# Patient Record
Sex: Female | Born: 2000 | Race: Black or African American | Hispanic: No | Marital: Single | Smoking: Current every day smoker
Health system: Southern US, Community
[De-identification: ages and names within clinical notes are randomized; demographics above are authoritative.]

## PROBLEM LIST (undated history)

## (undated) ENCOUNTER — Ambulatory Visit: Payer: BC Managed Care – PPO

---
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
## 2022-08-23 ENCOUNTER — Other Ambulatory Visit: Payer: Self-pay

## 2022-08-23 ENCOUNTER — Emergency Department (HOSPITAL_COMMUNITY)
Admission: EM | Admit: 2022-08-23 | Discharge: 2022-08-23 | Disposition: A | Discharge status: Home / Self Care | Payer: BC Managed Care – PPO | Attending: Emergency Medicine | Admitting: Emergency Medicine

## 2022-08-23 ENCOUNTER — Ambulatory Visit
Admission: EM | Admit: 2022-08-23 | Discharge: 2022-08-23 | Disposition: A | Discharge status: Home / Self Care | Payer: BC Managed Care – PPO | Attending: Internal Medicine | Admitting: Internal Medicine

## 2022-08-23 ENCOUNTER — Emergency Department (HOSPITAL_COMMUNITY): Payer: BC Managed Care – PPO

## 2022-08-23 ENCOUNTER — Encounter: Payer: Self-pay | Admitting: Emergency Medicine

## 2022-08-23 DIAGNOSIS — Z3202 Encounter for pregnancy test, result negative: Secondary | ICD-10-CM | POA: Diagnosis not present

## 2022-08-23 DIAGNOSIS — R350 Frequency of micturition: Secondary | ICD-10-CM

## 2022-08-23 DIAGNOSIS — R109 Unspecified abdominal pain: Secondary | ICD-10-CM

## 2022-08-23 DIAGNOSIS — N83291 Other ovarian cyst, right side: Secondary | ICD-10-CM | POA: Diagnosis not present

## 2022-08-23 DIAGNOSIS — N9489 Other specified conditions associated with female genital organs and menstrual cycle: Secondary | ICD-10-CM | POA: Diagnosis not present

## 2022-08-23 DIAGNOSIS — N760 Acute vaginitis: Secondary | ICD-10-CM | POA: Diagnosis not present

## 2022-08-23 DIAGNOSIS — R112 Nausea with vomiting, unspecified: Secondary | ICD-10-CM | POA: Diagnosis not present

## 2022-08-23 DIAGNOSIS — R0789 Other chest pain: Secondary | ICD-10-CM | POA: Insufficient documentation

## 2022-08-23 DIAGNOSIS — N939 Abnormal uterine and vaginal bleeding, unspecified: Secondary | ICD-10-CM

## 2022-08-23 DIAGNOSIS — R300 Dysuria: Secondary | ICD-10-CM | POA: Diagnosis not present

## 2022-08-23 DIAGNOSIS — B9689 Other specified bacterial agents as the cause of diseases classified elsewhere: Secondary | ICD-10-CM | POA: Insufficient documentation

## 2022-08-23 DIAGNOSIS — R197 Diarrhea, unspecified: Secondary | ICD-10-CM

## 2022-08-23 DIAGNOSIS — N83201 Unspecified ovarian cyst, right side: Secondary | ICD-10-CM

## 2022-08-23 LAB — WET PREP, GENITAL
Sperm: NONE SEEN
Trich, Wet Prep: NONE SEEN
WBC, Wet Prep HPF POC: <10 (ref <10)
Yeast Wet Prep HPF POC: NONE SEEN

## 2022-08-23 LAB — POCT URINALYSIS DIP (MANUAL ENTRY)
Bilirubin, UA: NEGATIVE
Blood, UA: NEGATIVE
Glucose, UA: NEGATIVE mg/dL
Ketones, POC UA: NEGATIVE mg/dL
Leukocytes, UA: NEGATIVE
Nitrite, UA: NEGATIVE
Spec Grav, UA: 1.02 (ref 1.010–1.025)
Urobilinogen, UA: 0.2 E.U./dL
pH, UA: 7 (ref 5.0–8.0)

## 2022-08-23 LAB — COMPREHENSIVE METABOLIC PANEL
ALT: 10 U/L (ref 0–44)
AST: 15 U/L (ref 15–41)
Albumin: 3.8 g/dL (ref 3.5–5.0)
Alkaline Phosphatase: 93 U/L (ref 38–126)
Anion gap: 9 (ref 5–15)
BUN: 8 mg/dL (ref 6–20)
CO2: 22 mmol/L (ref 22–32)
Calcium: 9.1 mg/dL (ref 8.9–10.3)
Chloride: 106 mmol/L (ref 98–111)
Creatinine, Ser: 0.7 mg/dL (ref 0.44–1.00)
GFR, Estimated: >60 mL/min (ref >60)
Glucose, Bld: 94 mg/dL (ref 70–99)
Potassium: 3.8 mmol/L (ref 3.5–5.1)
Sodium: 137 mmol/L (ref 135–145)
Total Bilirubin: 0.5 mg/dL (ref 0.3–1.2)
Total Protein: 7.5 g/dL (ref 6.5–8.1)

## 2022-08-23 LAB — CBC
HCT: 38.4 % (ref 36.0–46.0)
Hemoglobin: 12.7 g/dL (ref 12.0–15.0)
MCH: 30.7 pg (ref 26.0–34.0)
MCHC: 33.1 g/dL (ref 30.0–36.0)
MCV: 92.8 fL (ref 80.0–100.0)
Platelets: 314 10*3/uL (ref 150–400)
RBC: 4.14 MIL/uL (ref 3.87–5.11)
RDW: 11.9 % (ref 11.5–15.5)
WBC: 12 10*3/uL — ABNORMAL HIGH (ref 4.0–10.5)
nRBC: 0 % (ref 0.0–0.2)

## 2022-08-23 LAB — URINALYSIS, ROUTINE W REFLEX MICROSCOPIC
Bilirubin Urine: NEGATIVE
Glucose, UA: NEGATIVE mg/dL
Hgb urine dipstick: NEGATIVE
Ketones, ur: 5 mg/dL — AB
Nitrite: NEGATIVE
Protein, ur: 30 mg/dL — AB
Specific Gravity, Urine: 1.025 (ref 1.005–1.030)
WBC, UA: >50 WBC/hpf — ABNORMAL HIGH (ref 0–5)
pH: 7 (ref 5.0–8.0)

## 2022-08-23 LAB — I-STAT BETA HCG BLOOD, ED (MC, WL, AP ONLY): I-stat hCG, quantitative: <5 m[IU]/mL (ref <5)

## 2022-08-23 LAB — LIPASE, BLOOD: Lipase: 37 U/L (ref 11–51)

## 2022-08-23 LAB — D-DIMER, QUANTITATIVE: D-Dimer, Quant: 2.16 ug/mL-FEU — ABNORMAL HIGH (ref 0.00–0.50)

## 2022-08-23 LAB — POCT URINE PREGNANCY: Preg Test, Ur: NEGATIVE

## 2022-08-23 MED ORDER — IOHEXOL 350 MG/ML SOLN
100.0000 mL | Freq: Once | INTRAVENOUS | Status: AC | PRN
  Administered 2022-08-23: 100 mL via INTRAVENOUS

## 2022-08-23 MED ORDER — METRONIDAZOLE 500 MG PO TABS: 500.0000 mg | ORAL_TABLET | Freq: Two times a day (BID) | ORAL | 0 refills | Status: AC

## 2022-08-23 NOTE — ED Provider Notes (Signed)
[facility]-EMERGENCY DEPT Provider Note   CSN: [id number] Arrival date & time: 08/23/22  1020     History  Chief Complaint  Patient presents with   Abdominal Pain    Jane Friedman is a 21 y.o. female.   Abdominal Pain Patient presents with right-sided chest pain and right side abdominal pain.  Sent from urgent care.  Has had some dysuria and urinary symptoms for around a month now.  States she took around 2 to 3 days of antibiotics that she had and some Azo from the pharmacy.  States now pain is worse.  Some frequency.  Also has had some vaginal discharge.  States it is yellow.  States she is worried she has an STD.  However pain worse on the right side.  Menses has been somewhat irregular also.  Has had unprotected sex.  Patient is not pregnant. Also has had right upper chest pain.  Pleuritic.  Does not feel short of breath but pain worse with a deep breath.  No fever.  No coughing.  Has not had pains like this before.  Patient is a smoker.    No past medical history on file.  Home Medications Prior to Admission medications   Medication Sig Start Date End Date Taking? Authorizing Provider  metroNIDAZOLE (FLAGYL) 500 MG tablet Take 1 tablet (500 mg total) by mouth 2 (two) times daily. 08/23/22  Yes Benjiman Core, MD  naproxen (NAPROSYN) 500 MG tablet Take 1 tablet (500 mg total) by mouth 2 (two) times daily. Patient not taking: Reported on 08/23/2022 04/23/21   Raspet, Noberto Retort, PA-C  predniSONE (STERAPRED UNI-PAK 21 TAB) 10 MG (21) TBPK tablet Take by mouth daily. Take 6 tabs by mouth daily  for 2 days, then 5 tabs for 2 days, then 4 tabs for 2 days, then 3 tabs for 2 days, 2 tabs for 2 days, then 1 tab by mouth daily for 2 days Patient not taking: Reported on 08/23/2022 12/22/21   Gustavus Bryant, FNP  promethazine-dextromethorphan (PROMETHAZINE-DM) 6.25-15 MG/5ML syrup Take 5 mLs by mouth 2 (two) times daily as needed for cough. Patient not taking: Reported  on 08/23/2022 04/23/21   Raspet, Noberto Retort, PA-C      Allergies    Patient has no known allergies.    Review of Systems   Review of Systems  Gastrointestinal:  Positive for abdominal pain.    Physical Exam Updated Vital Signs BP 109/78   Pulse 86   Temp 98.7 F (37.1 C)   Resp (!) 21   Ht 5\' 2"  (1.575 m)   Wt 73 kg   LMP 07/18/2022   SpO2 100%   BMI 29.44 kg/m  Physical Exam Vitals and nursing note reviewed.  HENT:     Head: Atraumatic.  Cardiovascular:     Rate and Rhythm: Normal rate.  Chest:     Chest wall: No tenderness.  Abdominal:     Comments: Right lower quadrant tenderness without rebound or guarding.  No hernia palpated.  Skin:    General: Skin is warm.     Capillary Refill: Capillary refill takes less than 2 seconds.  Neurological:     Mental Status: She is alert and oriented to person, place, and time.   Pelvic exam showed some white vaginal discharge.  Mild adnexal tenderness on right.  ED Results / Procedures / Treatments   Labs (all labs ordered are listed, but only abnormal results are displayed) Labs Reviewed  WET PREP, GENITAL -  Abnormal; Notable for the following components:      Result Value   Clue Cells Wet Prep HPF POC PRESENT (*)    All other components within normal limits  CBC - Abnormal; Notable for the following components:   WBC 12.0 (*)    All other components within normal limits  URINALYSIS, ROUTINE W REFLEX MICROSCOPIC - Abnormal; Notable for the following components:   APPearance CLOUDY (*)    Ketones, ur 5 (*)    Protein, ur 30 (*)    Leukocytes,Ua MODERATE (*)    WBC, UA >50 (*)    Bacteria, UA RARE (*)    All other components within normal limits  D-DIMER, QUANTITATIVE - Abnormal; Notable for the following components:   D-Dimer, Quant 2.16 (*)    All other components within normal limits  LIPASE, BLOOD  COMPREHENSIVE METABOLIC PANEL  RPR  HIV ANTIBODY (ROUTINE TESTING W REFLEX)  I-STAT BETA HCG BLOOD, ED ([facility], [facility], [facility]  ONLY)  GC/CHLAMYDIA PROBE AMP ([place]) NOT AT [facility]    EKG None  Radiology CT Angio Chest PE W and/or Wo Contrast  Result Date: 08/23/2022 CLINICAL DATA:  Positive D-dimer with right-sided chest, shoulder and abdominal pain, initial encounter EXAM: CT ANGIOGRAPHY CHEST CT ABDOMEN AND PELVIS WITH CONTRAST TECHNIQUE: Multidetector CT imaging of the chest was performed using the standard protocol during bolus administration of intravenous contrast. Multiplanar CT image reconstructions and MIPs were obtained to evaluate the vascular anatomy. Multidetector CT imaging of the abdomen and pelvis was performed using the standard protocol during bolus administration of intravenous contrast. RADIATION DOSE REDUCTION: This exam was performed according to the departmental dose-optimization program which includes automated exposure control, adjustment of the mA and/or kV according to patient size and/or use of iterative reconstruction technique. CONTRAST:  OMNIPAQUE IOHEXOL 350 MG/ML SOLN COMPARISON:  Chest x-ray from earlier in the same day, CT from 06/23/2021. FINDINGS: CTA CHEST FINDINGS Cardiovascular: Thoracic aorta is within normal limits. No aneurysmal dilatation is seen. No cardiac enlargement is noted. The pulmonary artery is well visualized within normal branching pattern bilaterally. No filling defect to suggest pulmonary embolism is seen. Mediastinum/Nodes: Thoracic inlet is within normal limits. No hilar or mediastinal adenopathy is noted. The esophagus is within limits. Lungs/Pleura: Lungs are well aerated bilaterally. No focal infiltrate or effusion is seen. No parenchymal nodules are noted. Musculoskeletal: Bony structures of the thorax are within normal limits. Review of the MIP images confirms the above findings. CT ABDOMEN and PELVIS FINDINGS Hepatobiliary: No focal liver abnormality is seen. No gallstones, gallbladder wall thickening, or biliary dilatation. Pancreas: Unremarkable. No  pancreatic ductal dilatation or surrounding inflammatory changes. Spleen: Normal in size without focal abnormality. Adrenals/Urinary Tract: Adrenal glands are within normal limits. Kidneys demonstrate a normal enhancement pattern bilaterally. No renal calculi or obstructive changes are seen. The bladder is partially distended. Stomach/Bowel: The appendix is within normal limits. No obstructive or inflammatory changes of the colon are seen. The small bowel and stomach are within normal limits. Vascular/Lymphatic: No significant vascular findings are present. No enlarged abdominal or pelvic lymph nodes. Reproductive: Uterus is within normal limits. 2.1 cm simple appearing right ovarian cyst is noted. Other: No abdominal wall hernia or abnormality. No abdominopelvic ascites. Musculoskeletal: No acute or significant osseous findings. Review of the MIP images confirms the above findings. IMPRESSION: CTA of the chest: No evidence of pulmonary emboli. No acute abnormality noted. CT of the abdomen and pelvis: 2.1 cm right ovarian simple-appearing cyst. No follow-up imaging  is recommended. Reference: JACR 2020 Feb;17(2):248-254 No other focal abnormality is noted. Electronically Signed   By: Alcide Clever M.D.   On: 08/23/2022 22:15   CT ABDOMEN PELVIS W CONTRAST  Result Date: 08/23/2022 CLINICAL DATA:  Positive D-dimer with right-sided chest, shoulder and abdominal pain, initial encounter EXAM: CT ANGIOGRAPHY CHEST CT ABDOMEN AND PELVIS WITH CONTRAST TECHNIQUE: Multidetector CT imaging of the chest was performed using the standard protocol during bolus administration of intravenous contrast. Multiplanar CT image reconstructions and MIPs were obtained to evaluate the vascular anatomy. Multidetector CT imaging of the abdomen and pelvis was performed using the standard protocol during bolus administration of intravenous contrast. RADIATION DOSE REDUCTION: This exam was performed according to the departmental  dose-optimization program which includes automated exposure control, adjustment of the mA and/or kV according to patient size and/or use of iterative reconstruction technique. CONTRAST:  OMNIPAQUE IOHEXOL 350 MG/ML SOLN COMPARISON:  Chest x-ray from earlier in the same day, CT from 06/23/2021. FINDINGS: CTA CHEST FINDINGS Cardiovascular: Thoracic aorta is within normal limits. No aneurysmal dilatation is seen. No cardiac enlargement is noted. The pulmonary artery is well visualized within normal branching pattern bilaterally. No filling defect to suggest pulmonary embolism is seen. Mediastinum/Nodes: Thoracic inlet is within normal limits. No hilar or mediastinal adenopathy is noted. The esophagus is within limits. Lungs/Pleura: Lungs are well aerated bilaterally. No focal infiltrate or effusion is seen. No parenchymal nodules are noted. Musculoskeletal: Bony structures of the thorax are within normal limits. Review of the MIP images confirms the above findings. CT ABDOMEN and PELVIS FINDINGS Hepatobiliary: No focal liver abnormality is seen. No gallstones, gallbladder wall thickening, or biliary dilatation. Pancreas: Unremarkable. No pancreatic ductal dilatation or surrounding inflammatory changes. Spleen: Normal in size without focal abnormality. Adrenals/Urinary Tract: Adrenal glands are within normal limits. Kidneys demonstrate a normal enhancement pattern bilaterally. No renal calculi or obstructive changes are seen. The bladder is partially distended. Stomach/Bowel: The appendix is within normal limits. No obstructive or inflammatory changes of the colon are seen. The small bowel and stomach are within normal limits. Vascular/Lymphatic: No significant vascular findings are present. No enlarged abdominal or pelvic lymph nodes. Reproductive: Uterus is within normal limits. 2.1 cm simple appearing right ovarian cyst is noted. Other: No abdominal wall hernia or abnormality. No abdominopelvic ascites.  Musculoskeletal: No acute or significant osseous findings. Review of the MIP images confirms the above findings. IMPRESSION: CTA of the chest: No evidence of pulmonary emboli. No acute abnormality noted. CT of the abdomen and pelvis: 2.1 cm right ovarian simple-appearing cyst. No follow-up imaging is recommended. Reference: JACR 2020 Feb;17(2):248-254 No other focal abnormality is noted. Electronically Signed   By: Alcide Clever M.D.   On: 08/23/2022 22:15   DG Chest 2 View  Result Date: 08/23/2022 CLINICAL DATA:  Right rib pain and shoulder pain EXAM: CHEST - 2 VIEW COMPARISON:  Radiographs 12/22/2021 FINDINGS: The heart size and mediastinal contours are within normal limits. Both lungs are clear. No displaced rib fractures. IMPRESSION: No acute abnormality. Electronically Signed   By: Minerva Fester M.D.   On: 08/23/2022 20:02    Procedures Procedures    Medications Ordered in ED Medications  iohexol (OMNIPAQUE) 350 MG/ML injection 100 mL (100 mLs Intravenous Contrast Given 08/23/22 2159)    ED Course/ Medical Decision Making/ A&P                           Medical Decision Making Amount  and/or Complexity of Data Reviewed Labs: ordered. Radiology: ordered.  Risk Prescription drug management.   Patient with both chest pain and abdominal pain.  Abdominal pain has right lower quadrant tenderness vaginal discharge and dysuria.  White count mildly elevated.  Urine shows potential infection but also does have squamous cells.  We will get pelvic exam to evaluate for causes such as PID.  Also on the differential is appendicitis.  Potentially will need further imaging such as ultrasound or CT scan.  Also has pleuritic right-sided chest pain.  Is a smoker.  Somewhat severe but not hypoxic.  Will get D-dimer and if positive will get CT scan.  We will also get x-ray first.  Lungs clear.  No trauma.  No right upper quadrant tenderness and doubt referred pain from abdomen. CT scan of the chest  abdomen and pelvis reassuring.  No pulmonary embolism.  Potentially musculoskeletal pain versus pleurisy.  CT scan of the abdomen pelvis showed reassuring appendix but does have a ovarian cyst.  No adnexal mass.  Doubt tubo-ovarian abscess.  Wet prep shows bacterial vaginosis.  Will treat with Flagyl and have follow-up as an outpatient needed.  Will discharge home.       Final Clinical Impression(s) / ED Diagnoses Final diagnoses:  Bacterial vaginosis  Cyst of right ovary    Rx / DC Orders ED Discharge Orders          Ordered    metroNIDAZOLE (FLAGYL) 500 MG tablet  2 times daily        08/23/22 2247              Benjiman CorePickering, Violett Hobbs, MD 08/23/22 2312

## 2022-08-23 NOTE — Discharge Instructions (Signed)
Please go to the emergency department as soon as you leave urgent care for further evaluation and management of your abdominal pain and other symptoms.

## 2022-08-23 NOTE — ED Notes (Signed)
Patient is being discharged from the Urgent Care and sent to the Emergency Department via POV . Per Ervin Knack NP, patient is in need of higher level of care due to severe abdominal pain. Patient is aware and verbalizes understanding of plan of care.  Vitals:   08/23/22 0911  BP: 106/69  Pulse: 70  Resp: 16  Temp: 98.6 F (37 C)  SpO2: 98%

## 2022-08-23 NOTE — ED Triage Notes (Addendum)
Pt is present today with right rib and shoulder pain x2 days. Pt states that the pain starts in her shoulder and radiates down to her ribs. Pt denies any injury  Pt states that she has bloody discharge, abdominal pain, and urinary frequency x1 month

## 2022-08-23 NOTE — ED Triage Notes (Signed)
Pt reports right shoulder and chest pain, lower abd pain, and vaginal bleeding and discharge x few days.

## 2022-08-23 NOTE — ED Provider Notes (Signed)
[facility]    CSN: [id number] Arrival date & time: 08/23/22  1696      History   Chief Complaint Chief Complaint  Patient presents with   Shoulder Pain   Flank Pain   Urinary Frequency   Vaginal Discharge    HPI Jane Friedman is a 21 y.o. female.   Patient presents with several different chief complaints today.  Patient reports that she developed right-sided rib/abdominal pain yesterday that also radiates up to her shoulder and into her neck at times.  She rates the pain 10/10 on pain scale and describes as a constant pain.  She reports that she also started having some nausea, vomiting, diarrhea at the same time.  Denies any blood in stool or emesis.  Denies any associated fever, cough, upper respiratory symptoms.  Denies any known sick contacts.  Patient also reports that she has been having some vaginal spotting, yellow vaginal discharge, urinary frequency that started about a month ago.  Denies urinary burning, back pain, fever.  Last menstrual cycle was in October but patient is not sure of exact date.  She states that she typically has monthly menstrual cycles.  She is sexually active and has had unprotected sexual intercourse but denies any exposure to STD that she is aware of.   Shoulder Pain Flank Pain  Urinary Frequency  Vaginal Discharge   History reviewed. No pertinent past medical history.  Patient Active Problem List   Diagnosis Date Noted   Abdominal pain 06/02/2020    Past Surgical History:  Procedure Laterality Date   OVARIAN CYST REMOVAL Left 10/27/2020    OB History   No obstetric history on file.      Home Medications    Prior to Admission medications   Medication Sig Start Date End Date Taking? Authorizing Provider  naproxen (NAPROSYN) 500 MG tablet Take 1 tablet (500 mg total) by mouth 2 (two) times daily. 04/23/21   Raspet, Noberto Retort, PA-C  predniSONE (STERAPRED UNI-PAK 21 TAB) 10 MG (21) TBPK tablet Take by mouth daily.  Take 6 tabs by mouth daily  for 2 days, then 5 tabs for 2 days, then 4 tabs for 2 days, then 3 tabs for 2 days, 2 tabs for 2 days, then 1 tab by mouth daily for 2 days 12/22/21   Gustavus Bryant, FNP  promethazine-dextromethorphan (PROMETHAZINE-DM) 6.25-15 MG/5ML syrup Take 5 mLs by mouth 2 (two) times daily as needed for cough. 04/23/21   Raspet, Noberto Retort, PA-C    Family History Family History  Problem Relation Age of Onset   Healthy Father     Social History Social History   Tobacco Use   Smoking status: Every Day   Smokeless tobacco: Current  Substance Use Topics   Alcohol use: Yes    Comment: Occasionally   Drug use: Yes    Frequency: 3.0 times per week    Types: Marijuana     Allergies   Patient has no known allergies.   Review of Systems Review of Systems Per HPI  Physical Exam Triage Vital Signs ED Triage Vitals  Enc Vitals Group     BP 08/23/22 0911 106/69     Pulse Rate 08/23/22 0911 70     Resp 08/23/22 0911 16     Temp 08/23/22 0911 98.6 F (37 C)     Temp src --      SpO2 08/23/22 0911 98 %     Weight --      Height --  Head Circumference --      Peak Flow --      Pain Score 08/23/22 0910 10     Pain Loc --      Pain Edu? --      Excl. in GC? --    No data found.  Updated Vital Signs BP 106/69   Pulse 70   Temp 98.6 F (37 C)   Resp 16   SpO2 98%   Visual Acuity Right Eye Distance:   Left Eye Distance:   Bilateral Distance:    Right Eye Near:   Left Eye Near:    Bilateral Near:     Physical Exam Constitutional:      General: She is not in acute distress.    Appearance: Normal appearance. She is ill-appearing. She is not toxic-appearing or diaphoretic.     Comments: Patient is sitting upright holding her stomach as if she is in pain during physical exam and history intake.  HENT:     Head: Normocephalic and atraumatic.  Eyes:     Extraocular Movements: Extraocular movements intact.     Conjunctiva/sclera: Conjunctivae normal.   Cardiovascular:     Rate and Rhythm: Normal rate and regular rhythm.     Pulses: Normal pulses.     Heart sounds: Normal heart sounds.  Pulmonary:     Effort: Pulmonary effort is normal. No respiratory distress.     Breath sounds: Normal breath sounds. No stridor. No wheezing, rhonchi or rales.  Chest:     Comments: No tenderness to palpation to right lateral ribs.  No swelling or discoloration noted. Abdominal:     General: Bowel sounds are normal. There is no distension.     Palpations: Abdomen is soft.     Tenderness: There is abdominal tenderness in the right lower quadrant.     Comments: Patient is significantly tender to palpation to right lower quadrant.  She has minimal tenderness to palpation to left lower quadrant.  Musculoskeletal:     Comments: There is no tenderness to palpation to right upper extremity/shoulder.  She has full range of motion of upper extremity.  Grip strength is 5/5.  Neurovascular intact.  No obvious swelling or discoloration noted.  Neurological:     General: No focal deficit present.     Mental Status: She is alert and oriented to person, place, and time. Mental status is at baseline.  Psychiatric:        Mood and Affect: Mood normal.        Behavior: Behavior normal.        Thought Content: Thought content normal.        Judgment: Judgment normal.      UC Treatments / Results  Labs (all labs ordered are listed, but only abnormal results are displayed) Labs Reviewed  POCT URINALYSIS DIP (MANUAL ENTRY) - Abnormal; Notable for the following components:      Result Value   Protein Ur, POC trace (*)    All other components within normal limits  POCT URINE PREGNANCY    EKG   Radiology No results found.  Procedures Procedures (including critical care time)  Medications Ordered in UC Medications - No data to display  Initial Impression / Assessment and Plan / UC Course  I have reviewed the triage vital signs and the nursing  notes.  Pertinent labs & imaging results that were available during my care of the patient were reviewed by me and considered in my medical decision making (see  chart for details).     Urinalysis unremarkable.  Urine pregnancy test was negative.  I am very concerned the patient may need more advanced imaging given how significantly tender she is to palpation on exam to abdomen.  Therefore, patient was advised to go to the ER for further evaluation and management and was agreeable with plan.  Will defer all other evaluation and management to the ER.  Vital signs and patient stable at discharge.  Agree with patient self transport to the hospital. Final Clinical Impressions(s) / UC Diagnoses   Final diagnoses:  Continuous severe abdominal pain  Urine pregnancy test negative  Vaginal spotting  Nausea vomiting and diarrhea  Urinary frequency     Discharge Instructions      Please go to the emergency department as soon as you leave urgent care for further evaluation and management of your abdominal pain and other symptoms.    ED Prescriptions   None    PDMP not reviewed this encounter.   Gustavus Bryant, [place] 08/23/22 [phone]

## 2022-08-24 ENCOUNTER — Telehealth (HOSPITAL_COMMUNITY): Payer: Self-pay

## 2022-08-24 ENCOUNTER — Ambulatory Visit
Admission: EM | Admit: 2022-08-24 | Discharge: 2022-08-24 | Disposition: A | Discharge status: Other Acute Inpatient Hospital | Payer: BC Managed Care – PPO

## 2022-08-24 LAB — GC/CHLAMYDIA PROBE AMP (~~LOC~~) NOT AT ARMC
Chlamydia: POSITIVE — AB
Comment: NEGATIVE
Comment: NORMAL
Neisseria Gonorrhea: NEGATIVE

## 2022-08-24 LAB — HIV ANTIBODY (ROUTINE TESTING W REFLEX): HIV Screen 4th Generation wRfx: NONREACTIVE

## 2022-08-24 LAB — RPR: RPR Ser Ql: NONREACTIVE

## 2022-08-24 MED ORDER — DOXYCYCLINE HYCLATE 100 MG PO CAPS: 100.0000 mg | ORAL_CAPSULE | Freq: Two times a day (BID) | ORAL | 0 refills | Status: AC

## 2023-03-14 ENCOUNTER — Ambulatory Visit
Admission: RE | Admit: 2023-03-14 | Discharge: 2023-03-14 | Disposition: A | Discharge status: Home / Self Care | Payer: BC Managed Care – PPO | Source: Ambulatory Visit | Attending: Physician Assistant | Admitting: Physician Assistant

## 2023-03-14 VITALS — BP 105/72 | HR 96 | Temp 99.8°F | Resp 18

## 2023-03-14 DIAGNOSIS — J029 Acute pharyngitis, unspecified: Secondary | ICD-10-CM

## 2023-03-14 LAB — POCT RAPID STREP A (OFFICE): Rapid Strep A Screen: NEGATIVE

## 2023-03-14 NOTE — ED Triage Notes (Signed)
Pt reports a 3-day H/O fatigue, body aches and sore throat.Pt has not taken any medication to help with symptoms.

## 2023-03-14 NOTE — Discharge Instructions (Addendum)
Return if any problems.  Tylenol every 4 hours.  Return if any problems.   

## 2023-03-14 NOTE — ED Provider Notes (Signed)
[facility]    CSN: [id number] Arrival date & time: 03/14/23  1457      History   Chief Complaint Chief Complaint  Patient presents with   Headache   Fatigue   Sore Throat    HPI Jane Friedman is a 22 y.o. female.   Patient complains of a sore throat and congestion.  Patient reports symptoms started 3 days ago.  Patient denies fever or chills she has not had any cough.  The history is provided by the patient. No language interpreter was used.  Headache Pain location:  Generalized Onset quality:  Gradual Duration:  3 days Similar to prior headaches: no   Coughing: 3.   Associated symptoms: no abdominal pain   Sore Throat Associated symptoms include headaches. Pertinent negatives include no abdominal pain.    History reviewed. No pertinent past medical history.  Patient Active Problem List   Diagnosis Date Noted   Abdominal pain 06/02/2020    Past Surgical History:  Procedure Laterality Date   OVARIAN CYST REMOVAL Left 10/27/2020    OB History   No obstetric history on file.      Home Medications    Prior to Admission medications   Medication Sig Start Date End Date Taking? Authorizing Provider  metroNIDAZOLE (FLAGYL) 500 MG tablet Take 1 tablet (500 mg total) by mouth 2 (two) times daily. 08/23/22   Benjiman Core, MD  naproxen (NAPROSYN) 500 MG tablet Take 1 tablet (500 mg total) by mouth 2 (two) times daily. Patient not taking: Reported on 08/23/2022 04/23/21   Raspet, Noberto Retort, PA-C  predniSONE (STERAPRED UNI-PAK 21 TAB) 10 MG (21) TBPK tablet Take by mouth daily. Take 6 tabs by mouth daily  for 2 days, then 5 tabs for 2 days, then 4 tabs for 2 days, then 3 tabs for 2 days, 2 tabs for 2 days, then 1 tab by mouth daily for 2 days Patient not taking: Reported on 08/23/2022 12/22/21   Gustavus Bryant, FNP  promethazine-dextromethorphan (PROMETHAZINE-DM) 6.25-15 MG/5ML syrup Take 5 mLs by mouth 2 (two) times daily as needed for cough. Patient  not taking: Reported on 08/23/2022 04/23/21   Raspet, Noberto Retort, PA-C    Family History Family History  Problem Relation Age of Onset   Healthy Father     Social History Social History   Tobacco Use   Smoking status: Every Day   Smokeless tobacco: Current  Substance Use Topics   Alcohol use: Yes    Comment: Occasionally   Drug use: Yes    Frequency: 3.0 times per week    Types: Marijuana     Allergies   Patient has no known allergies.   Review of Systems Review of Systems  Gastrointestinal:  Negative for abdominal pain.  Neurological:  Positive for headaches.  All other systems reviewed and are negative.    Physical Exam Triage Vital Signs ED Triage Vitals  Enc Vitals Group     BP 03/14/23 1515 105/72     Pulse Rate 03/14/23 1515 96     Resp 03/14/23 1515 18     Temp 03/14/23 1515 99.8 F (37.7 C)     Temp Source 03/14/23 1515 Oral     SpO2 03/14/23 1515 98 %     Weight --      Height --      Head Circumference --      Peak Flow --      Pain Score 03/14/23 1627 3  Pain Loc --      Pain Edu? --      Excl. in GC? --    No data found.  Updated Vital Signs BP 105/72 (BP Location: Left Arm)   Pulse 96   Temp 99.8 F (37.7 C) (Oral)   Resp 18   LMP 02/01/2023   SpO2 98%   Visual Acuity Right Eye Distance:   Left Eye Distance:   Bilateral Distance:    Right Eye Near:   Left Eye Near:    Bilateral Near:     Physical Exam Vitals and nursing note reviewed.  Constitutional:      Appearance: She is well-developed.  HENT:     Head: Normocephalic.  Cardiovascular:     Rate and Rhythm: Normal rate.  Pulmonary:     Effort: Pulmonary effort is normal.  Abdominal:     General: There is no distension.  Musculoskeletal:        General: Normal range of motion.     Cervical back: Normal range of motion.  Skin:    General: Skin is warm.  Neurological:     General: No focal deficit present.     Mental Status: She is alert and oriented to person,  place, and time.      UC Treatments / Results  Labs (all labs ordered are listed, but only abnormal results are displayed) Labs Reviewed  POCT RAPID STREP A (OFFICE)    EKG   Radiology No results found.  Procedures Procedures (including critical care time)  Medications Ordered in UC Medications - No data to display  Initial Impression / Assessment and Plan / UC Course  I have reviewed the triage vital signs and the nursing notes.  Pertinent labs & imaging results that were available during my care of the patient were reviewed by me and considered in my medical decision making (see chart for details).      Final Clinical Impressions(s) / UC Diagnoses   Final diagnoses:  Acute pharyngitis, unspecified etiology     Discharge Instructions      Return if any problems.  Tylenol every 4 hours.  Return if any problems.    ED Prescriptions   None    PDMP not reviewed this encounter. An After Visit Summary was printed and given to the patient.    Elson Areas, [place] 03/14/23 1830

## 2023-03-17 ENCOUNTER — Ambulatory Visit
Admission: EM | Admit: 2023-03-17 | Discharge: 2023-03-17 | Disposition: A | Discharge status: Home / Self Care | Payer: BC Managed Care – PPO | Attending: Emergency Medicine | Admitting: Emergency Medicine

## 2023-03-17 ENCOUNTER — Other Ambulatory Visit: Payer: Self-pay | Admitting: Emergency Medicine

## 2023-03-17 DIAGNOSIS — N898 Other specified noninflammatory disorders of vagina: Secondary | ICD-10-CM | POA: Diagnosis not present

## 2023-03-17 LAB — POCT URINALYSIS DIP (MANUAL ENTRY)
Bilirubin, UA: NEGATIVE
Glucose, UA: NEGATIVE mg/dL
Ketones, POC UA: NEGATIVE mg/dL
Leukocytes, UA: NEGATIVE
Nitrite, UA: NEGATIVE
Protein Ur, POC: 30 mg/dL — AB
Spec Grav, UA: 1.025 (ref 1.010–1.025)
Urobilinogen, UA: 0.2 E.U./dL
pH, UA: 6.5 (ref 5.0–8.0)

## 2023-03-17 LAB — POCT URINE PREGNANCY: Preg Test, Ur: NEGATIVE

## 2023-03-17 NOTE — Discharge Instructions (Addendum)
You will get a call if tests are positive, you will not get a call if tests are negative but you can check results in MyChart if you have a MyChart account.   Your urine does not show signs of infection like a UTI

## 2023-03-17 NOTE — ED Provider Notes (Signed)
[facility]    CSN: [id number] Arrival date & time: 03/17/23  1219      History   Chief Complaint No chief complaint on file.   HPI Jane Friedman is a 22 y.o. female. Had chlamydia last winter and feels like she might have it again. Reports low abd feeling of fullness, urinary frequency, vaginal discharge, and headache. Reports this is how she felt last winter when she had chlamydia. Uses condoms for sex but one broke in may, symptoms started after that. Has period right now. No fever or chills, flank pain, n/v    HPI  History reviewed. No pertinent past medical history.  Patient Active Problem List   Diagnosis Date Noted   Abdominal pain 06/02/2020    Past Surgical History:  Procedure Laterality Date   OVARIAN CYST REMOVAL Left 10/27/2020    OB History   No obstetric history on file.      Home Medications    Prior to Admission medications   Medication Sig Start Date End Date Taking? Authorizing Provider  metroNIDAZOLE (FLAGYL) 500 MG tablet Take 1 tablet (500 mg total) by mouth 2 (two) times daily. 08/23/22   Benjiman Core, MD  naproxen (NAPROSYN) 500 MG tablet Take 1 tablet (500 mg total) by mouth 2 (two) times daily. Patient not taking: Reported on 08/23/2022 04/23/21   Raspet, Noberto Retort, PA-C  predniSONE (STERAPRED UNI-PAK 21 TAB) 10 MG (21) TBPK tablet Take by mouth daily. Take 6 tabs by mouth daily  for 2 days, then 5 tabs for 2 days, then 4 tabs for 2 days, then 3 tabs for 2 days, 2 tabs for 2 days, then 1 tab by mouth daily for 2 days Patient not taking: Reported on 08/23/2022 12/22/21   Gustavus Bryant, FNP  promethazine-dextromethorphan (PROMETHAZINE-DM) 6.25-15 MG/5ML syrup Take 5 mLs by mouth 2 (two) times daily as needed for cough. Patient not taking: Reported on 08/23/2022 04/23/21   Raspet, Noberto Retort, PA-C    Family History Family History  Problem Relation Age of Onset   Healthy Father     Social History Social History   Tobacco Use    Smoking status: Every Day   Smokeless tobacco: Current  Substance Use Topics   Alcohol use: Yes    Comment: Occasionally   Drug use: Yes    Frequency: 3.0 times per week    Types: Marijuana     Allergies   Patient has no known allergies.   Review of Systems Review of Systems   Physical Exam Triage Vital Signs ED Triage Vitals  Enc Vitals Group     BP 03/17/23 1253 107/69     Pulse Rate 03/17/23 1253 76     Resp 03/17/23 1253 18     Temp 03/17/23 1253 99 F (37.2 C)     Temp Source 03/17/23 1253 Oral     SpO2 03/17/23 1253 97 %     Weight --      Height --      Head Circumference --      Peak Flow --      Pain Score 03/17/23 1257 8     Pain Loc --      Pain Edu? --      Excl. in GC? --    No data found.  Updated Vital Signs BP 107/69 (BP Location: Left Arm)   Pulse 76   Temp 99 F (37.2 C) (Oral)   Resp 18   LMP 03/14/2023  SpO2 97%   Visual Acuity Right Eye Distance:   Left Eye Distance:   Bilateral Distance:    Right Eye Near:   Left Eye Near:    Bilateral Near:     Physical Exam Constitutional:      Appearance: Normal appearance.  Cardiovascular:     Rate and Rhythm: Normal rate and regular rhythm.  Pulmonary:     Effort: Pulmonary effort is normal.     Breath sounds: Normal breath sounds.  Abdominal:     General: Abdomen is flat. Bowel sounds are normal.     Tenderness: There is abdominal tenderness in the suprapubic area. There is no guarding or rebound.     Hernia: No hernia is present.     Comments: Minimal tenderness to palpation of suprapubic area  Neurological:     Mental Status: She is alert.      UC Treatments / Results  Labs (all labs ordered are listed, but only abnormal results are displayed) Labs Reviewed  POCT URINALYSIS DIP (MANUAL ENTRY) - Abnormal; Notable for the following components:      Result Value   Color, UA orange (*)    Clarity, UA cloudy (*)    Blood, UA large (*)    Protein Ur, POC =30 (*)    All  other components within normal limits  POCT URINE PREGNANCY  CERVICOVAGINAL ANCILLARY ONLY    EKG   Radiology No results found.  Procedures Procedures (including critical care time)  Medications Ordered in UC Medications - No data to display  Initial Impression / Assessment and Plan / UC Course  I have reviewed the triage vital signs and the nursing notes.  Pertinent labs & imaging results that were available during my care of the patient were reviewed by me and considered in my medical decision making (see chart for details).     Vaginal swab results pending. Will wait to treat until results come back  Final Clinical Impressions(s) / UC Diagnoses   Final diagnoses:  Vaginal discharge     Discharge Instructions      You will get a call if tests are positive, you will not get a call if tests are negative but you can check results in MyChart if you have a MyChart account.   Your urine does not show signs of infection like a UTI    ED Prescriptions   None    PDMP not reviewed this encounter.   Cathlyn Parsons, NP 03/17/23 1426

## 2023-03-17 NOTE — ED Triage Notes (Signed)
Pt reports she thinks she never has gotten over the chlamydia she had back in 11/23. Pt has foul vaginal odor, red and white period blood, having to change her pad more frequently, frequent urination, not a complete stream, headache, low abdominal pain that hurts to the touch, low back pain, x 1 month. Recently has gotten worse.

## 2023-03-23 LAB — NUSWAB BV AND CANDIDA, NAA
Candida albicans, NAA: NEGATIVE
Candida glabrata, NAA: NEGATIVE

## 2023-03-23 LAB — CHLAMYDIA/GONOCOCCUS/TRICHOMONAS, NAA
Chlamydia by NAA: NEGATIVE
Gonococcus by NAA: NEGATIVE
Trich vag by NAA: NEGATIVE

## 2023-03-23 LAB — SPECIMEN STATUS REPORT

## 2023-08-12 IMAGING — CT CT ABD-PELV W/ CM
2 of 4 series · 17 of 46 positions shown, 19 images · IV contrast (APPLIED)
Comparison: CT the abdomen and pelvis 06/02/2020.

CLINICAL DATA: 20-year-old female with history of right lower
quadrant abdominal pain. Suspected acute appendicitis.

EXAM:
CT ABDOMEN AND PELVIS WITH CONTRAST
TECHNIQUE: Multidetector CT imaging of the abdomen and pelvis was performed
using the standard protocol following bolus administration of
intravenous contrast.
CONTRAST:  60mL OMNIPAQUE IOHEXOL 350 MG/ML SOLN

[Series 2: abd pel w · axial · 0.73mm/px · z∈[+559,+919]mm · 14 of 80 slices shown, 16 images]
[im 4/80  soft-tissue]
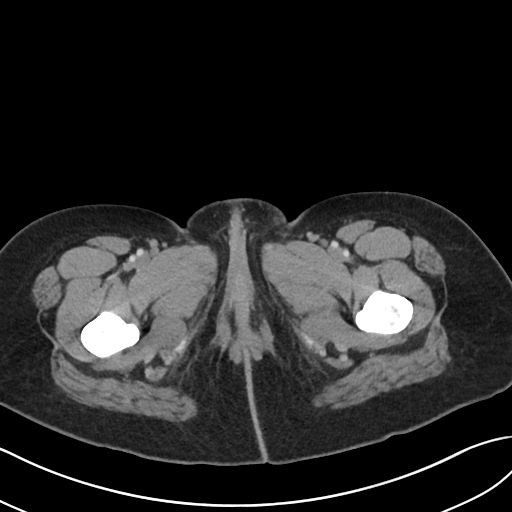
[im 4/80  bone]
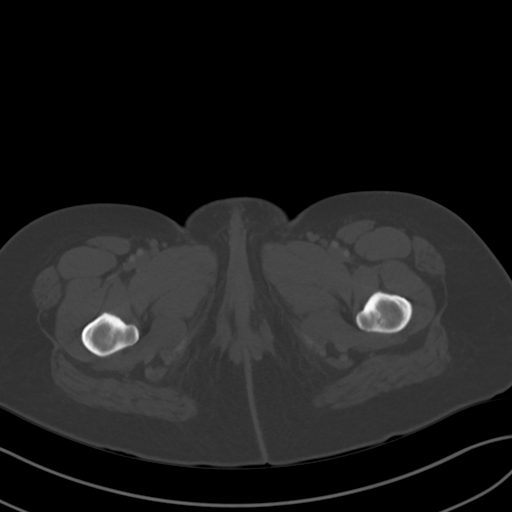
[im 10/80  soft-tissue]
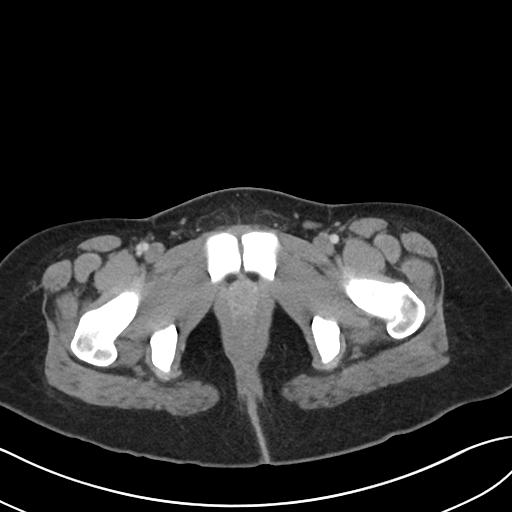
[im 17/80  soft-tissue]
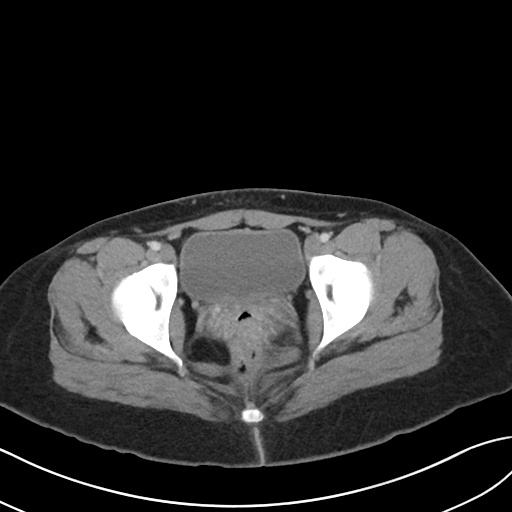
[im 20/80  soft-tissue]
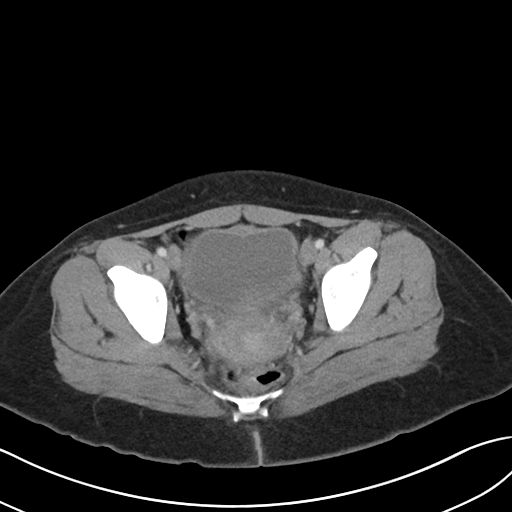
[im 27/80  soft-tissue]
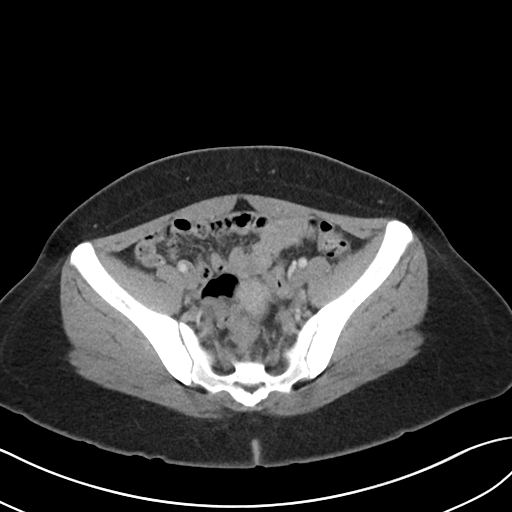
[im 33/80  soft-tissue]
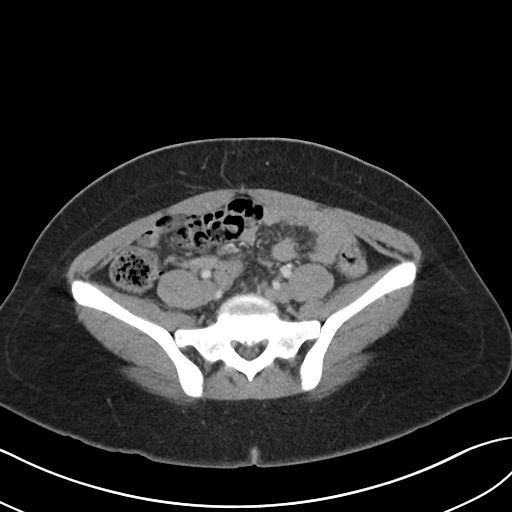
[im 37/80  soft-tissue]
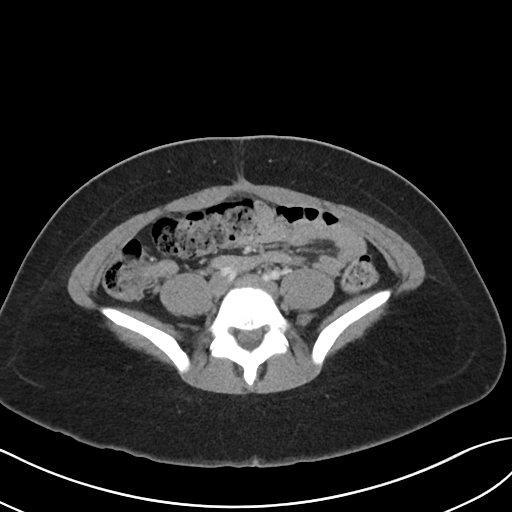
[im 43/80  soft-tissue]
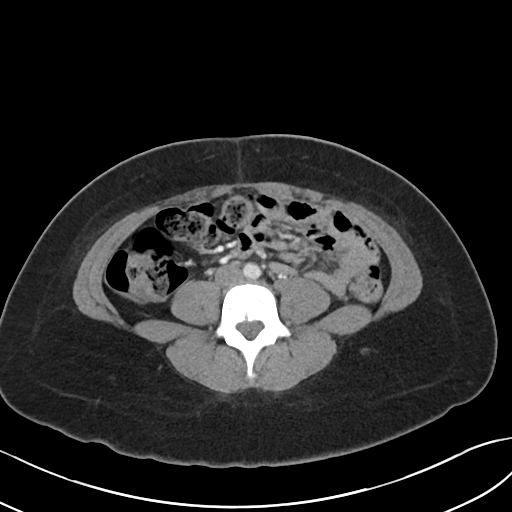
[im 47/80  soft-tissue]
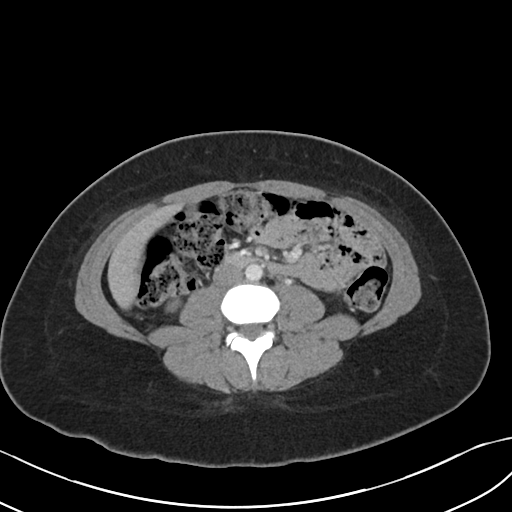
[im 47/80  bone]
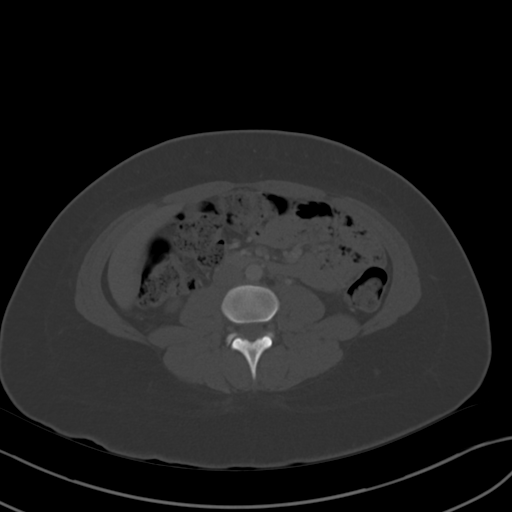
[im 53/80  soft-tissue]
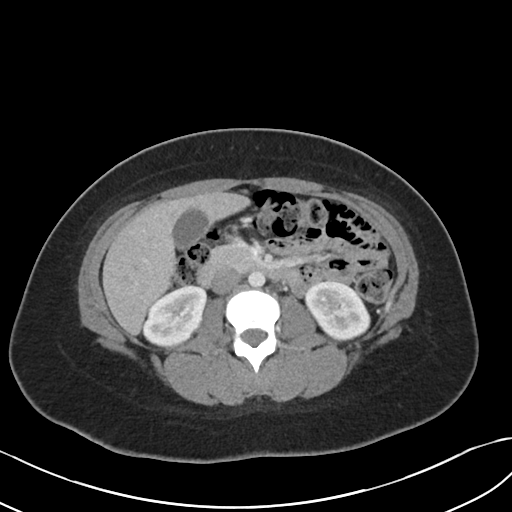
[im 60/80  soft-tissue]
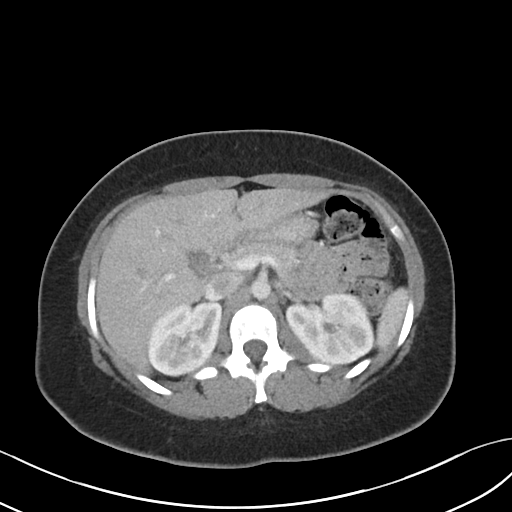
[im 63/80  soft-tissue]
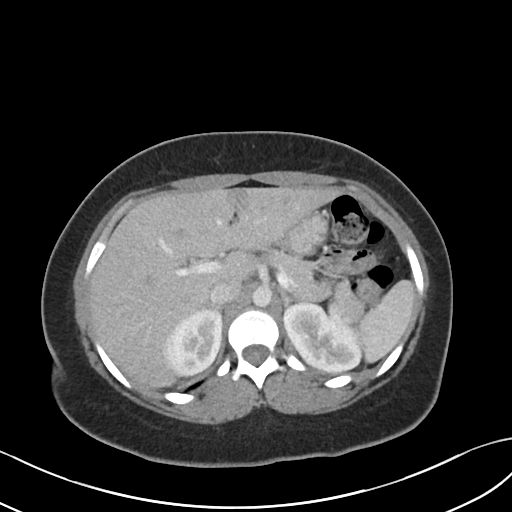
[im 70/80  soft-tissue]
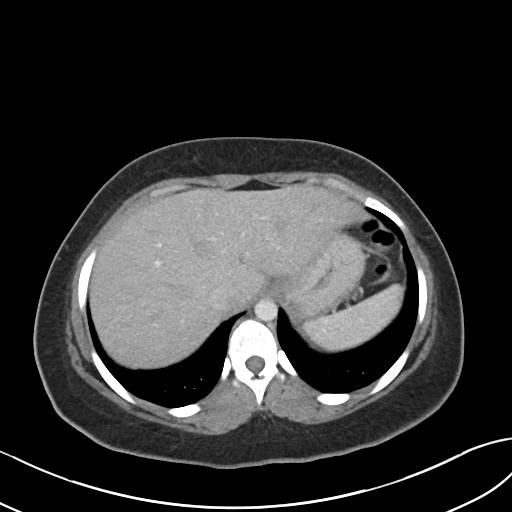
[im 76/80  soft-tissue]
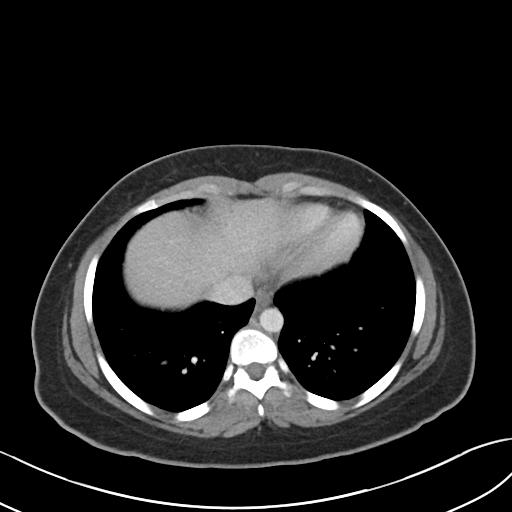

[Series 5: coronal · coronal · 0.76mm/px · 3 of 81 slices shown]
[im 27/81  soft-tissue]
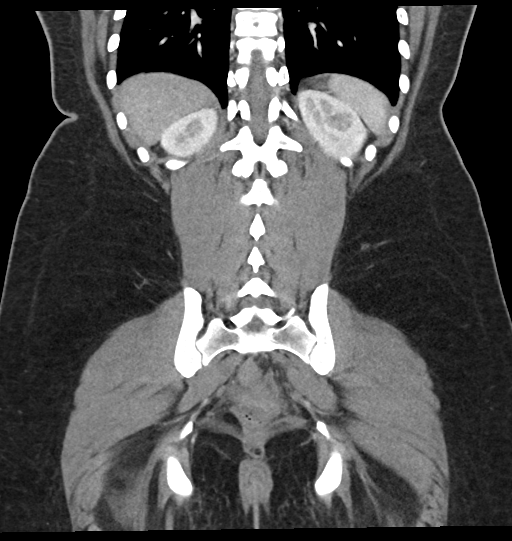
[im 36/81  soft-tissue]
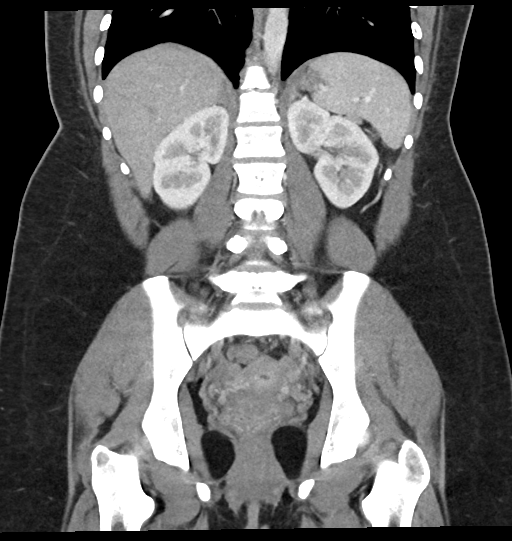
[im 45/81  soft-tissue]
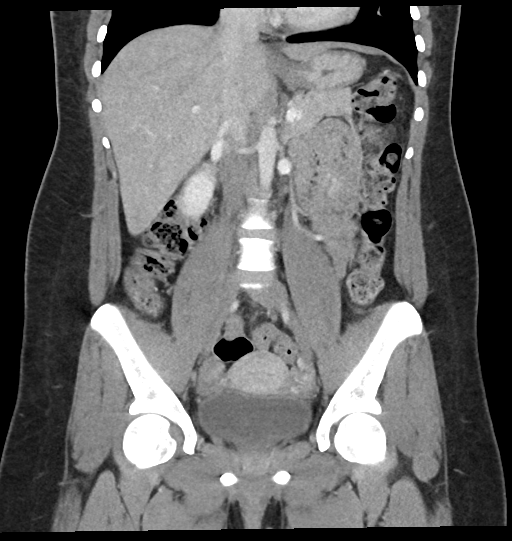

[17 of 46 positions shown; findings below may reference images not displayed]

FINDINGS: Lower chest: Unremarkable.

Hepatobiliary: No suspicious cystic or solid hepatic lesions. No
intra or extrahepatic biliary ductal dilatation. Gallbladder is
normal in appearance.

Pancreas: No pancreatic mass. No pancreatic ductal dilatation. No
pancreatic or peripancreatic fluid collections or inflammatory
changes.

Spleen: Unremarkable.

Adrenals/Urinary Tract: Bilateral kidneys and bilateral adrenal
glands are normal in appearance. No hydroureteronephrosis. Urinary
bladder is normal in appearance.

Stomach/Bowel: The appearance of the stomach is normal. No
pathologic dilatation of small bowel or colon. Normal appendix
(coronal images 50 and 51 of series 5).

Vascular/Lymphatic: No significant atherosclerotic disease, aneurysm
or dissection noted in the abdominal or pelvic vasculature. No
lymphadenopathy noted in the abdomen or pelvis.

Reproductive: Uterus and ovaries are unremarkable in appearance.

Other: No significant volume of ascites.  No pneumoperitoneum.

Musculoskeletal: There are no aggressive appearing lytic or blastic
lesions noted in the visualized portions of the skeleton.
IMPRESSION: 1. No acute findings are noted in the abdomen or pelvis to account
for the patient's symptoms. Specifically, the appendix is normal.

## 2024-02-10 IMAGING — DX DG CHEST 2V
2 series · 2 of 2 positions shown · non-contrast
Comparison: None.

CLINICAL DATA: Cough, fever

EXAM:
CHEST - 2 VIEW

[chest pa]
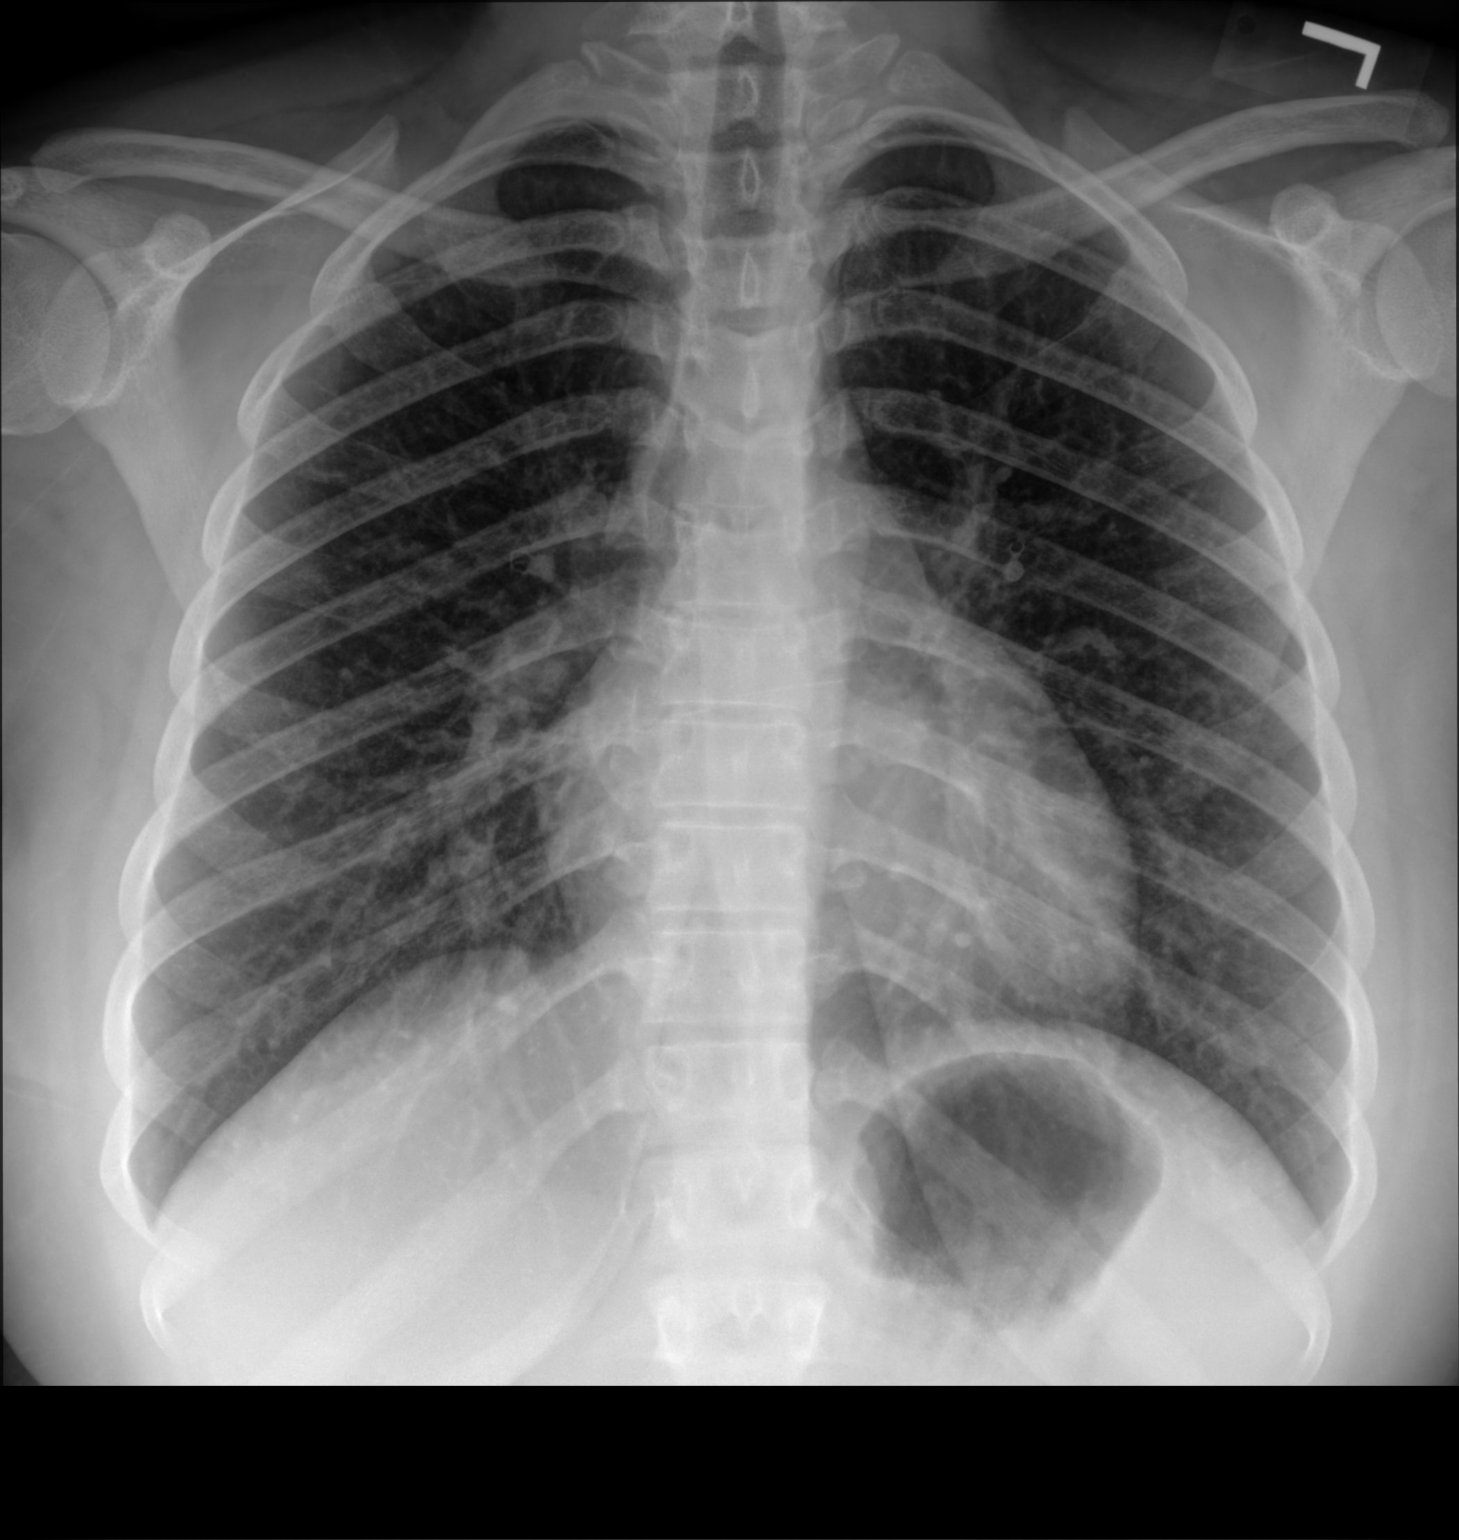

[chest lat]
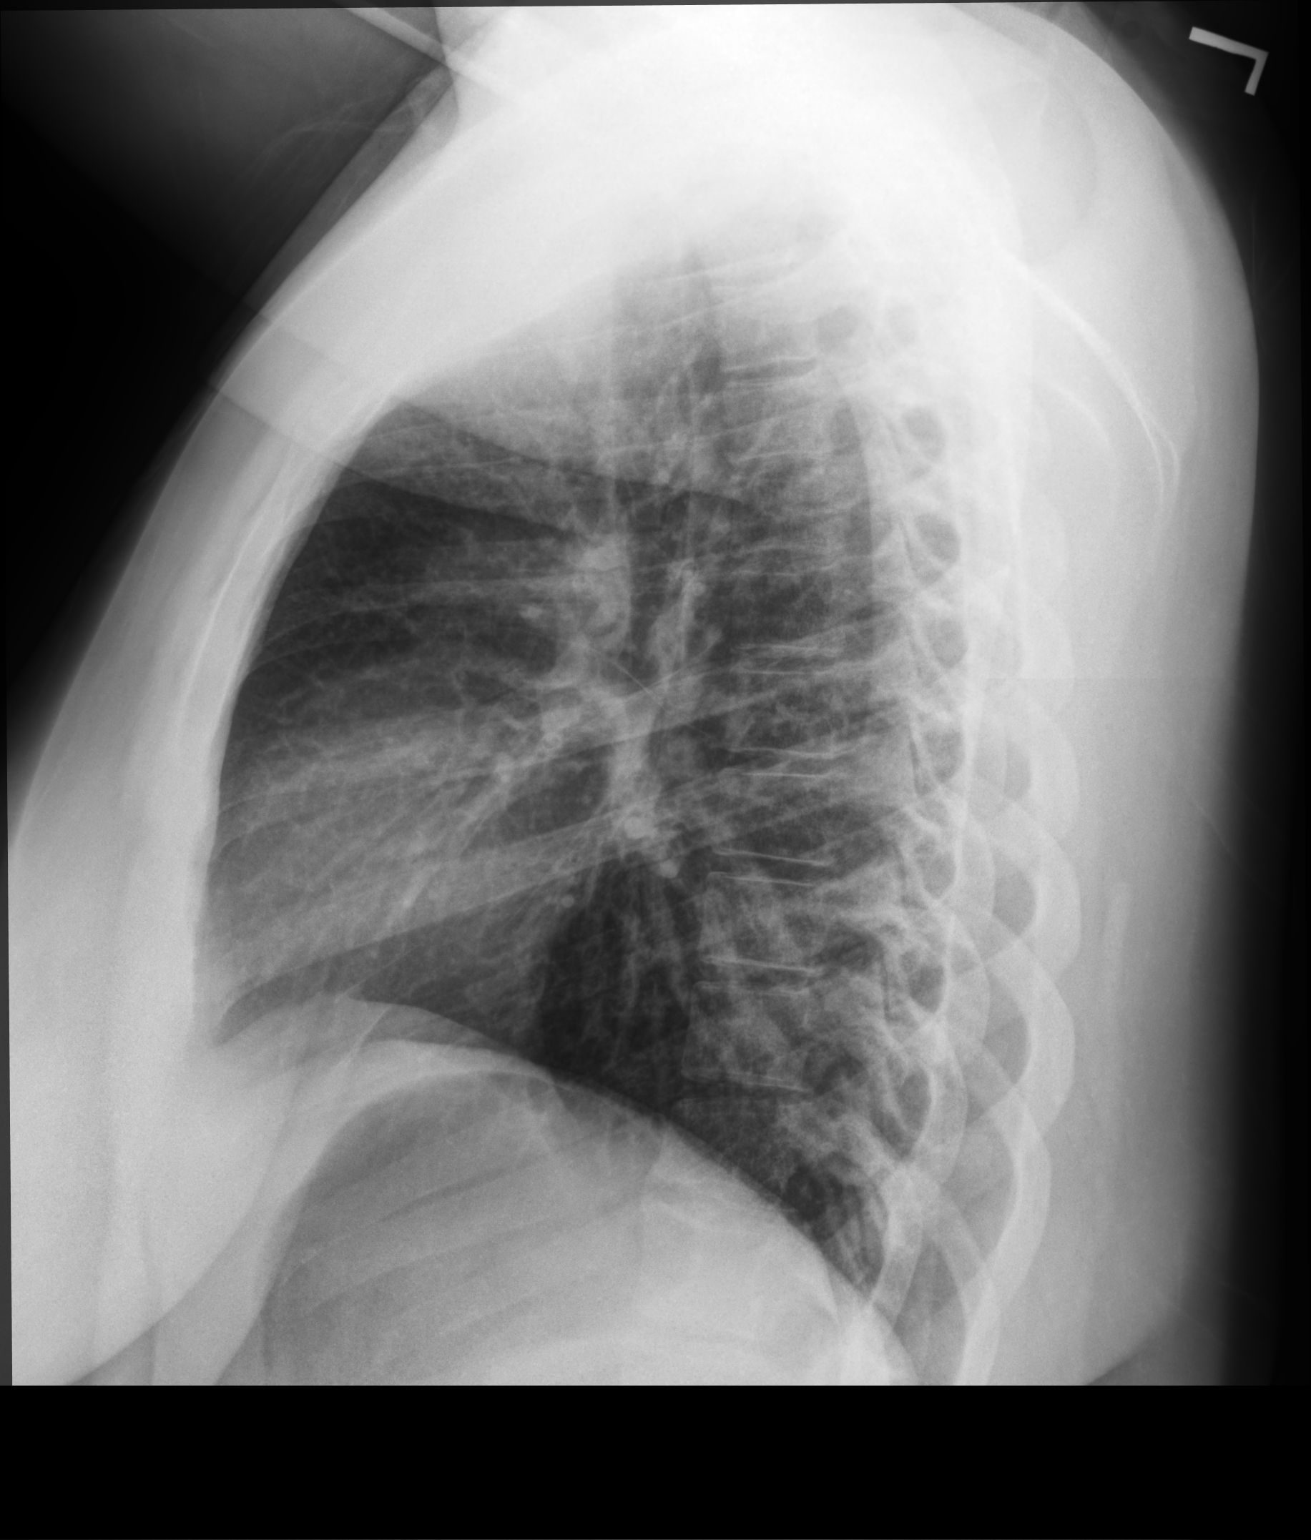

[2 of 2 positions shown; findings below may reference images not displayed]

FINDINGS: Frontal and lateral views of the chest demonstrate an unremarkable
cardiac silhouette. No acute airspace disease, effusion, or
pneumothorax. No acute bony abnormalities.
IMPRESSION: 1. No acute intrathoracic process.
